# Patient Record
Sex: Female | Born: 1980 | Race: White | Hispanic: No | Marital: Married | Smoking: Current every day smoker
Health system: Southern US, Community
[De-identification: ages and names within clinical notes are randomized; demographics above are authoritative.]

## PROBLEM LIST (undated history)

## (undated) DIAGNOSIS — E079 Disorder of thyroid, unspecified: Secondary | ICD-10-CM

## (undated) DIAGNOSIS — L732 Hidradenitis suppurativa: Secondary | ICD-10-CM

---

## 2000-04-20 ENCOUNTER — Other Ambulatory Visit: Admission: RE | Admit: 2000-04-20 | Discharge: 2000-04-20 | Payer: Self-pay | Admitting: Obstetrics and Gynecology

## 2001-07-01 ENCOUNTER — Other Ambulatory Visit: Admission: RE | Admit: 2001-07-01 | Discharge: 2001-07-01 | Payer: Self-pay | Admitting: Obstetrics and Gynecology

## 2002-06-02 ENCOUNTER — Other Ambulatory Visit: Admission: RE | Admit: 2002-06-02 | Discharge: 2002-06-02 | Payer: Self-pay | Admitting: Obstetrics and Gynecology

## 2003-12-07 ENCOUNTER — Other Ambulatory Visit: Admission: RE | Admit: 2003-12-07 | Discharge: 2003-12-07 | Payer: Self-pay | Admitting: Obstetrics and Gynecology

## 2005-01-06 ENCOUNTER — Other Ambulatory Visit: Admission: RE | Admit: 2005-01-06 | Discharge: 2005-01-06 | Payer: Self-pay | Admitting: Obstetrics & Gynecology

## 2005-12-29 ENCOUNTER — Ambulatory Visit (HOSPITAL_COMMUNITY): Admission: RE | Admit: 2005-12-29 | Discharge: 2005-12-29 | Payer: Self-pay | Admitting: *Deleted

## 2005-12-30 ENCOUNTER — Ambulatory Visit (HOSPITAL_COMMUNITY): Admission: RE | Admit: 2005-12-30 | Discharge: 2005-12-30 | Payer: Self-pay | Admitting: *Deleted

## 2006-01-08 ENCOUNTER — Encounter: Admission: RE | Admit: 2006-01-08 | Discharge: 2006-01-08 | Payer: Self-pay | Admitting: Nephrology

## 2006-02-02 ENCOUNTER — Inpatient Hospital Stay (HOSPITAL_COMMUNITY): Admission: AD | Admit: 2006-02-02 | Discharge: 2006-02-03 | Payer: Self-pay | Admitting: Obstetrics and Gynecology

## 2006-02-02 HISTORY — PX: LAPAROSCOPIC GASTRIC BANDING: SHX1100

## 2006-03-09 ENCOUNTER — Ambulatory Visit (HOSPITAL_COMMUNITY): Admission: RE | Admit: 2006-03-09 | Discharge: 2006-03-10 | Payer: Self-pay | Admitting: *Deleted

## 2006-03-16 ENCOUNTER — Ambulatory Visit (HOSPITAL_COMMUNITY): Admission: RE | Admit: 2006-03-16 | Discharge: 2006-03-17 | Payer: Self-pay | Admitting: *Deleted

## 2006-03-23 ENCOUNTER — Encounter: Admission: RE | Admit: 2006-03-23 | Discharge: 2006-06-21 | Payer: Self-pay | Admitting: Nephrology

## 2006-05-02 ENCOUNTER — Emergency Department (HOSPITAL_COMMUNITY): Admission: EM | Admit: 2006-05-02 | Discharge: 2006-05-03 | Payer: Self-pay | Admitting: *Deleted

## 2006-06-29 ENCOUNTER — Inpatient Hospital Stay (HOSPITAL_COMMUNITY): Admission: AD | Admit: 2006-06-29 | Discharge: 2006-06-29 | Payer: Self-pay | Admitting: Obstetrics and Gynecology

## 2007-01-05 ENCOUNTER — Ambulatory Visit: Payer: Self-pay | Admitting: Cardiology

## 2007-01-14 ENCOUNTER — Encounter: Payer: Self-pay | Admitting: Cardiology

## 2007-01-14 ENCOUNTER — Ambulatory Visit: Payer: Self-pay | Admitting: Cardiology

## 2007-01-14 ENCOUNTER — Ambulatory Visit: Payer: Self-pay

## 2007-01-14 LAB — CONVERTED CEMR LAB
CO2: 26 meq/L (ref 19–32)
Calcium: 8.9 mg/dL (ref 8.4–10.5)
Chloride: 106 meq/L (ref 96–112)
Creatinine, Ser: 0.5 mg/dL (ref 0.4–1.2)
Glucose, Bld: 92 mg/dL (ref 70–99)
Magnesium: 1.7 mg/dL (ref 1.5–2.5)
TSH: 3.4 microintl units/mL (ref 0.35–5.50)

## 2007-02-02 ENCOUNTER — Ambulatory Visit (HOSPITAL_COMMUNITY): Admission: RE | Admit: 2007-02-02 | Discharge: 2007-02-02 | Payer: Self-pay | Admitting: Obstetrics & Gynecology

## 2007-05-09 ENCOUNTER — Inpatient Hospital Stay (HOSPITAL_COMMUNITY): Admission: AD | Admit: 2007-05-09 | Discharge: 2007-05-11 | Payer: Self-pay | Admitting: Obstetrics & Gynecology

## 2008-09-17 ENCOUNTER — Ambulatory Visit (HOSPITAL_COMMUNITY): Admission: RE | Admit: 2008-09-17 | Discharge: 2008-09-17 | Payer: Self-pay | Admitting: Obstetrics & Gynecology

## 2008-09-28 ENCOUNTER — Ambulatory Visit (HOSPITAL_COMMUNITY): Admission: RE | Admit: 2008-09-28 | Discharge: 2008-09-28 | Payer: Self-pay | Admitting: Obstetrics and Gynecology

## 2008-11-30 ENCOUNTER — Ambulatory Visit (HOSPITAL_COMMUNITY): Admission: RE | Admit: 2008-11-30 | Discharge: 2008-11-30 | Payer: Self-pay | Admitting: Obstetrics and Gynecology

## 2008-12-11 ENCOUNTER — Ambulatory Visit (HOSPITAL_COMMUNITY): Admission: RE | Admit: 2008-12-11 | Discharge: 2008-12-11 | Payer: Self-pay | Admitting: Obstetrics and Gynecology

## 2009-01-12 ENCOUNTER — Inpatient Hospital Stay (HOSPITAL_COMMUNITY): Admission: AD | Admit: 2009-01-12 | Discharge: 2009-01-12 | Payer: Self-pay | Admitting: Obstetrics & Gynecology

## 2009-01-13 ENCOUNTER — Inpatient Hospital Stay (HOSPITAL_COMMUNITY): Admission: AD | Admit: 2009-01-13 | Discharge: 2009-01-15 | Payer: Self-pay | Admitting: Obstetrics & Gynecology

## 2010-02-23 ENCOUNTER — Encounter: Payer: Self-pay | Admitting: Obstetrics and Gynecology

## 2010-05-06 LAB — CBC
HCT: 27.2 % — ABNORMAL LOW (ref 36.0–46.0)
MCHC: 33.5 g/dL (ref 30.0–36.0)
MCV: 86.7 fL (ref 78.0–100.0)
MCV: 87.9 fL (ref 78.0–100.0)
Platelets: 183 10*3/uL (ref 150–400)
RBC: 4.18 MIL/uL (ref 3.87–5.11)
WBC: 12.6 10*3/uL — ABNORMAL HIGH (ref 4.0–10.5)

## 2010-05-06 LAB — RH IMMUNE GLOB WKUP(>/=20WKS)(NOT WOMEN'S HOSP)

## 2010-05-06 LAB — RPR: RPR Ser Ql: NONREACTIVE

## 2010-06-17 NOTE — Assessment & Plan Note (Signed)
[facility]                            CARDIOLOGY OFFICE NOTE   NAME:Maguire, ARADIA ESTEY                   MRN:          [id number]  DATE:01/05/2007                            DOB:          April 14, 1980    I was asked by Dr. Dareen Piano and Leilani Able to evaluate Nilda Calamity, a delightful 30 year old married white female, who is about  [redacted] weeks pregnant, for palpitations.   She has never had any palpitations or heart problems before.  She  noticed these as a flutter that lasted a few seconds.  If she checks  her carotid (she was once a CNA), she will feel that her heart is  beating regular.  She has had no presyncope, syncope, or shortness of  breath.  She has had no chest pain.   She has noticed some increased swelling in her lower extremities.  She  denies any true orthopnea or PND.   PAST MEDICAL HISTORY:  She has battled with her weight for years.  She  had a Lap Band by Dr. Baruch Merl on March 16, 2006.  Preoperative  x-ray showed mild chronic bronchitic changes, but heart size was normal.   She does not smoke.  She quit August 20, 2006.  She does not use  recreational products and never has.  She does not drink alcohol.  She  drinks very little caffeine.  She does not use any dietary supplements  at present or suppressants.  She is on prenatal vitamins only.   Her only surgery was a Lap Band in February 2008.   FAMILY HISTORY:  Negative for sudden cardiac death or premature coronary  disease.   SOCIAL HISTORY:  She works at [employer], mostly working with  computers.  She is married and has no children.   REVIEW OF SYSTEMS:  Negative, other than HPI.   PHYSICAL EXAMINATION:  VITAL SIGNS:  Blood pressure is elevated at  142/87.  She says on Monday, two days ago, it was 118/76 at their  office.  Her weight is 284.  Her height is 5 feet 3 inches.  Her heart  rate is 97 and regular.  Her EKG is normal, with a normal PR,  QRS, and  QTc interval.  Respiratory rate is 18.  GENERAL:  She is in no acute distress.  Alert and oriented.  HEENT:  Normocephalic, atraumatic.  PERRLA.  Extraocular movements  intact.  Sclerae clear.  Facial symmetry is normal.  SKIN:  Warm and dry.  NECK:  Supple.  Carotid upstrokes were equal bilaterally, without  bruits.  No JVD.  Thyroid is not enlarged.  Trachea is midline.  LUNGS:  Clear.  HEART:  Reveals a poorly appreciated PMI.  Normal S1, S2, without  gallop.  ABDOMEN:  Gravid.  Bowel sounds are present.  EXTREMITIES:  Revealed trace edema to 1+ edema.  Pulses are intact.  No  sign of DVT.  She does have a history of varicose veins.  NEUROLOGIC:  Intact.   ASSESSMENT:  1. Palpitations, most likely benign.  2. Peripheral edema.  3. Elevated blood pressure today.  She assures me it has been good and      will follow it closely.   PLAN:  1. A 2D echocardiogram to rule out any structural heart disease or      pregnancy-associated cardiomyopathy.  2. Check magnesium, potassium, and TSH.  3. Monitor blood pressure closely.     Thomas C. Daleen Squibb, MD, [facility]  Electronically Signed    TCW/MedQ  DD: 01/05/2007  DT: 01/06/2007  Job #: [id number]   cc:   Malva Limes, M.D.  Leilani Able, P.A.-C.

## 2010-06-20 NOTE — Op Note (Signed)
Tracey Reynolds, Tracey Reynolds            ACCOUNT NO.:  [id number]   MEDICAL RECORD NO.:  [id number]          PATIENT TYPE:  OIB   LOCATION:  6213                         FACILITY:  [facility]   PHYSICIAN:  Alfonse Ras, MD   DATE OF BIRTH:  May 08, 1980   DATE OF PROCEDURE:  03/16/2006  DATE OF DISCHARGE:                               OPERATIVE REPORT   PREOPERATIVE DIAGNOSIS:  Morbid obesity, medically refractory.   POSTOPERATIVE DIAGNOSIS:  Morbid obesity, medically refractory.   PROCEDURE:  Laparoscopic adjustable gastric banding with an APS standard  band.   ANESTHESIA:  General.   SURGEON:  Alfonse Ras, MD   ASSISTANT:  Sandria Bales. Ezzard Standing, M.D.   DESCRIPTION:  The patient was taken to the operating room and placed in  the supine position.  After adequate general anesthesia was induced  using the endotracheal tube, the abdomen was prepped and draped in a  normal sterile fashion.  Using a transverse supraumbilical incision, I  dissected down to the fascia.  This was opened vertically and an #0  Vicryl pursestring suture was placed around the fascial defect.  The  Hasson trocar was placed in the abdomen, and pneumoperitoneum was  obtained.   Under direct vision additional 15-and-11-mm trocars were placed.  A 5-mm  trocar was placed in the subxiphoid region to retract the liver  anteriorly.  A 5-mm port was placed in the left abdomen.  The angle of  His was identified, and sharply dissected.  There was some moderate  amount of bleeding from the diaphragm which was controlled easily with  clips.  The retrogastric space was then entered bluntly at the angle of  His.   I then turned my attention to the lesser curvature, and using a pars  flaccida technique, the lap band passer was placed in a retrogastric  position, and brought out at the angle of His.  A standard APS band was  placed through the band passer and brought out in the retrogastric  position.  Anesthesia had difficulty  passing the sizing tube, and the  patient had minimal fat around the upper part of the stomach; and,  therefore, we opted to go ahead and close the band without the sizing  tube in place.  This cinched down easily; and then the plication was  performed with interrupted 2-0 Ethibond sutures; approximating the  serosa-to-serosa in the standard fashion.  The band moved easily.   The [facility] liver retractor was removed under direct vision.  Pneumoperitoneum was released.  The band tubing was brought out through  the 11-mm trocar site.  It was attached to the port; and the port was  tacked down to the  anterior abdominal wall fascia, using interrupted 2-0 Prolenes.  Skin  incisions were closed with subcuticular 4-0 Monocryl.  Steri-Strips and  sterile dressings were applied.  The patient tolerated the procedure  well, and went to PACU in good condition.      Alfonse Ras, MD  Electronically Signed     KRE/MEDQ  D:  03/16/2006  T:  03/16/2006  Job:  [id number]

## 2010-06-20 NOTE — Op Note (Signed)
NAMEJASMARIE, Tracey Reynolds            ACCOUNT NO.:  [id number]   MEDICAL RECORD NO.:  [id number]          PATIENT TYPE:  AMB   LOCATION:  DAY                          [facility]AC[facility]   PHYSICIAN:  Alfonse Ras, MD   DATE OF BIRTH:  1980/08/28   DATE OF PROCEDURE:  03/09/2006  DATE OF DISCHARGE:                               OPERATIVE REPORT   PREOPERATIVE DIAGNOSIS:  Morbid obesity.   POSTOPERATIVE DIAGNOSIS:  Morbid obesity.   PROCEDURE:  Placement of laparoscopic trocar with probable intrathoracic  placement and then cancellation of laparoscopic adjustable gastric  banding.   HISTORY OF PRESENT ILLNESS:  Patient is a 30 year old white female with  medically refractory morbid obesity, who has been counseled extensively  about laparoscopic adjustable gastric banding.  She has gone through an  extensive preoperative workup and notified of the risks and benefits of  laparoscopic adjustable gastric banding.  Patient consented to proceed.   DESCRIPTION:  Patient was taken to the operating room and placed in a  supine position.  After adequate anesthesia was induced using an  endotracheal tube, the abdomen was prepped and draped in the normal  sterile fashion.  Using a small incision in the left upper quadrant at  an oblique angle, I placed the Optiview under direct view, saw  subcutaneous fat, saw muscle fibers, then entered the cavity.  What I  realized after insufflating a small amount of CO2 is that I was actually  visualizing the lung.  For that reason, the trocar was immediately  removed after aspirating the air.  I opted not to continue forward with  laparoscopic adjustable gastric banding, as I did not want to insufflate  the abdomen knowing that there was intrathoracic access.  The patient  remained hemodynamically stable.  She had one episode when we went into  head up of postural hypertension, which recovered when she went back to  flat position.  Her O2 sats remained in  the 99-100% range.  She had  normal breath sounds, and airway pressures remained normal.  The small  incision in the left upper quadrant was closed with staples.  She  tolerated the procedure hemodynamically well and was taken to the  recovery room for a STAT probable chest x-ray and continuous pulse ox  monitoring.      Alfonse Ras, MD  Electronically Signed     KRE/MEDQ  D:  03/09/2006  T:  03/09/2006  Job:  [id number]

## 2010-10-28 LAB — CBC
HCT: 27.1 — ABNORMAL LOW
Hemoglobin: 13.1
Hemoglobin: 9.5 — ABNORMAL LOW
MCV: 85.5
Platelets: 224
RBC: 3.19 — ABNORMAL LOW
RBC: 4.45
RDW: 14.9
WBC: 18 — ABNORMAL HIGH
WBC: 21.6 — ABNORMAL HIGH

## 2010-10-28 LAB — RPR: RPR Ser Ql: NONREACTIVE

## 2010-10-29 IMAGING — US US OB COMP +14 WK
2 series · 14 of 28 positions shown · non-contrast
Comparison: none

OBSTETRICAL ULTRASOUND:
 This ultrasound exam was performed in the [HOSPITAL] Ultrasound Department.  The OB US report was generated in the AS system, and faxed to the ordering physician.  This report is also available in [REDACTED] PACS.

[Series 1: us ob comp +14 wk · 12 of 47 slices shown (1 of 2)]
[im 2/47]
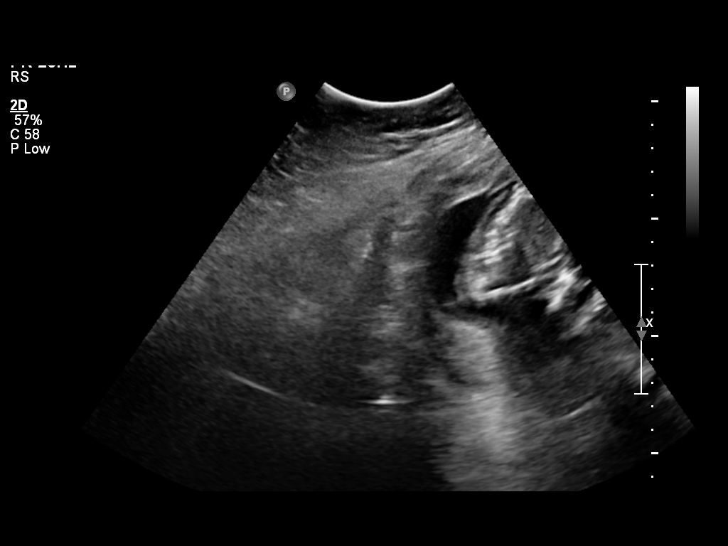
[im 6/47]
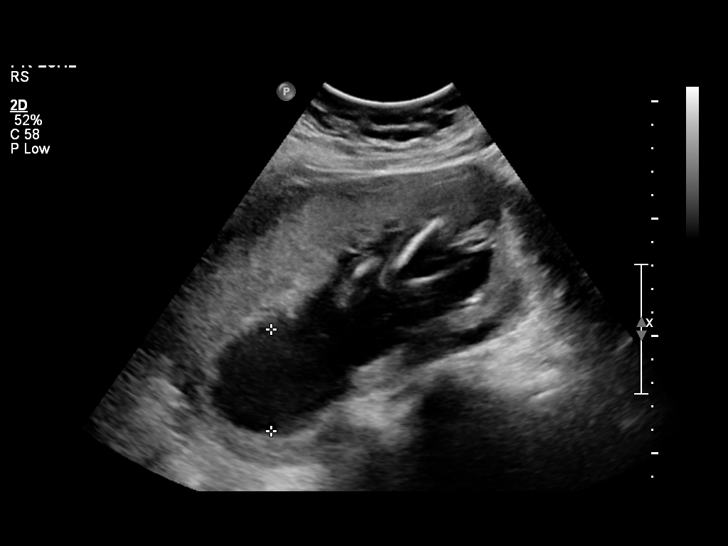
[im 10/47]
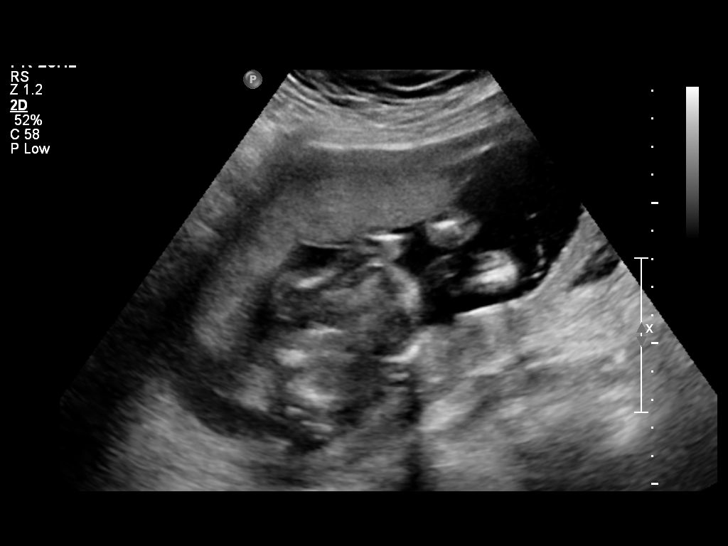
[im 14/47]
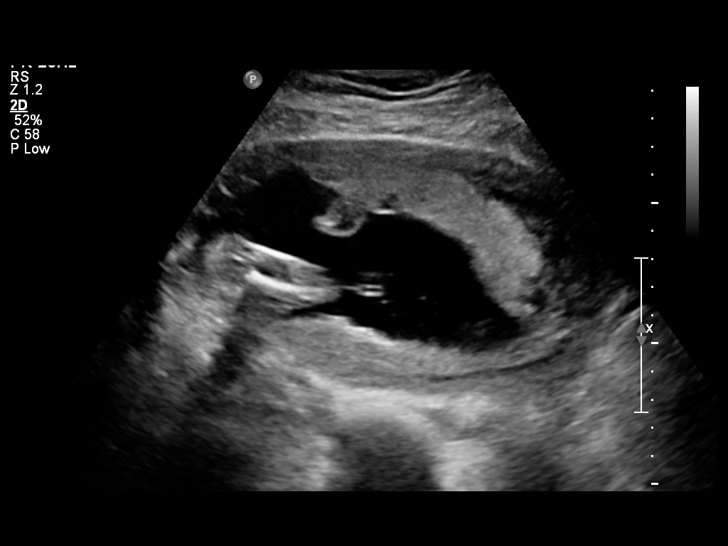
[im 18/47]
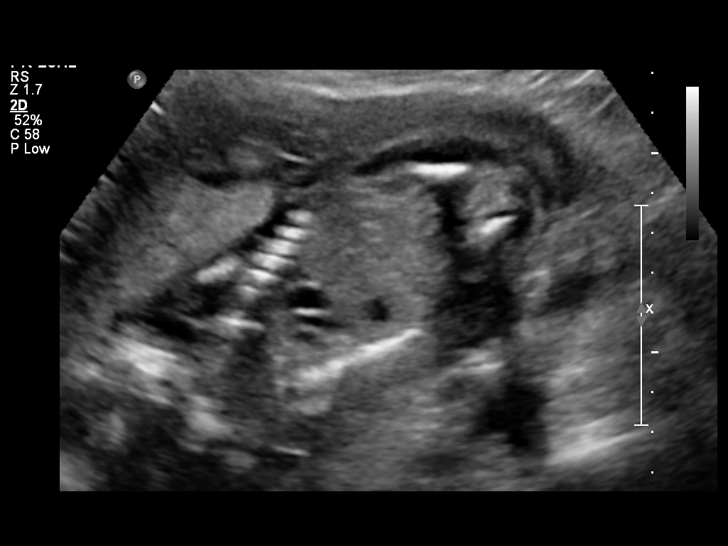
[im 22/47]
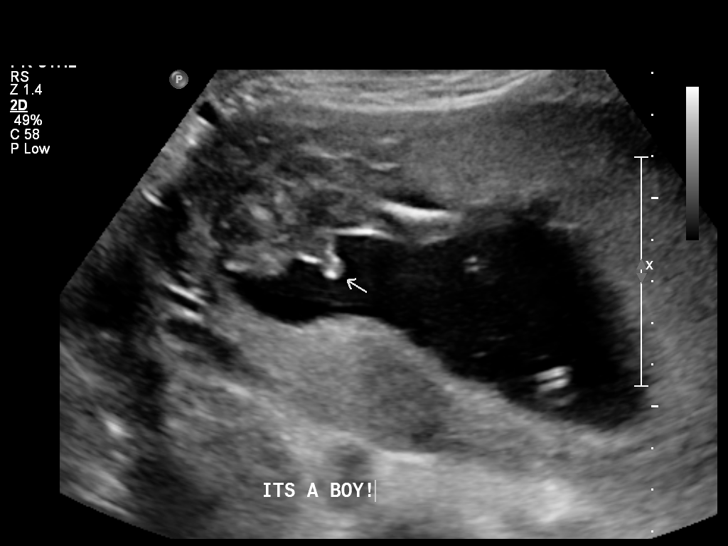
[im 25/47]
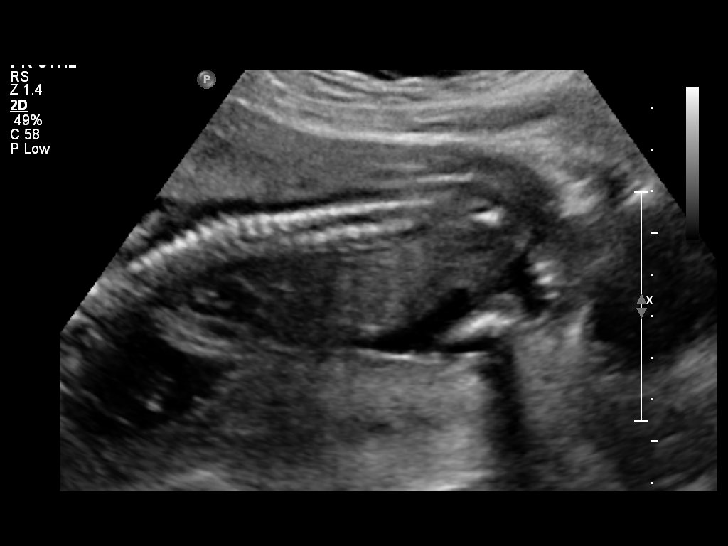
[im 29/47]
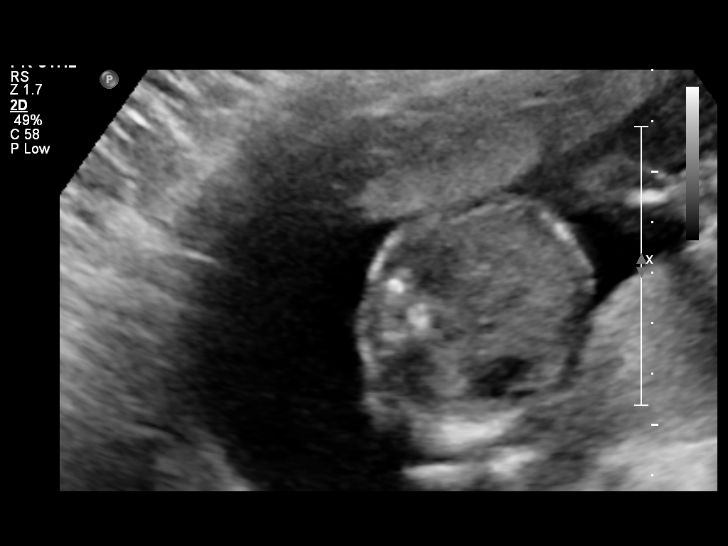
[im 33/47]
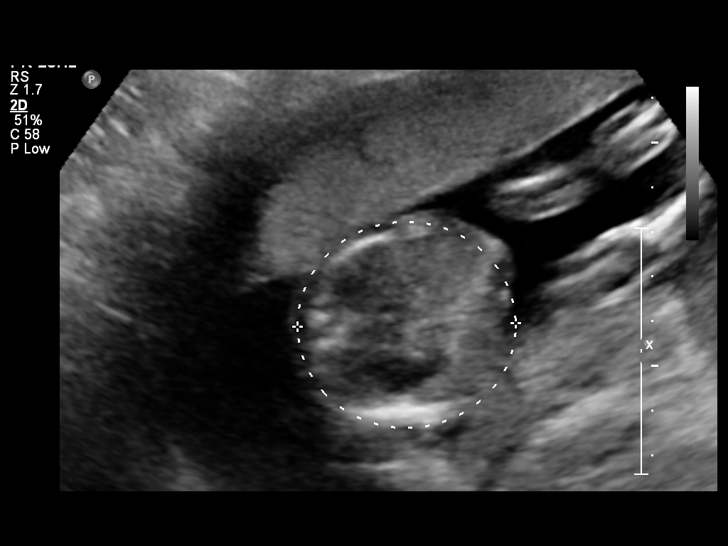
[im 37/47]
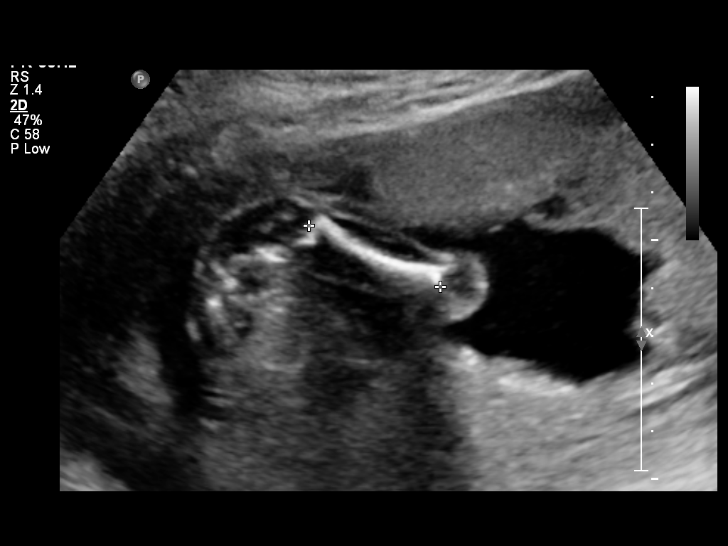
[im 41/47]
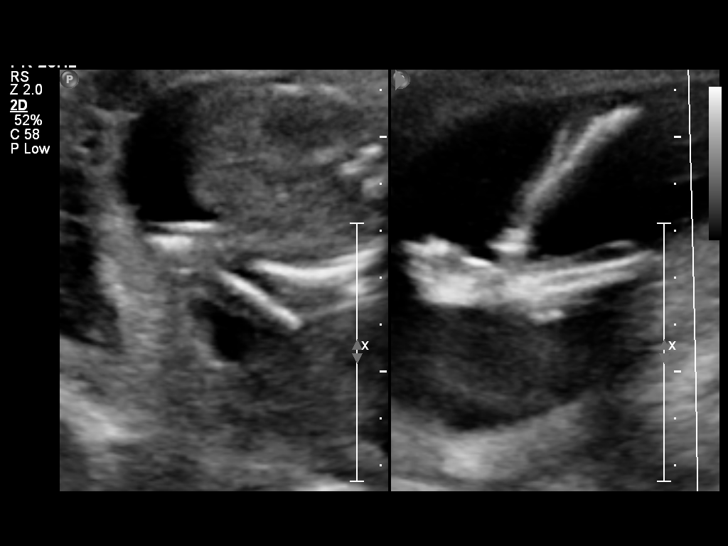
[im 45/47]
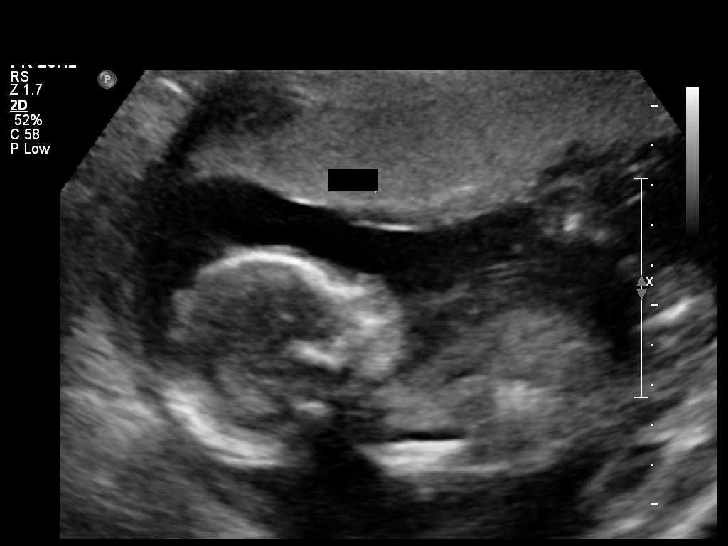

[Series 1: us ob comp +14 wk · 2 of 5 slices shown (2 of 2)]
[im 1/5]
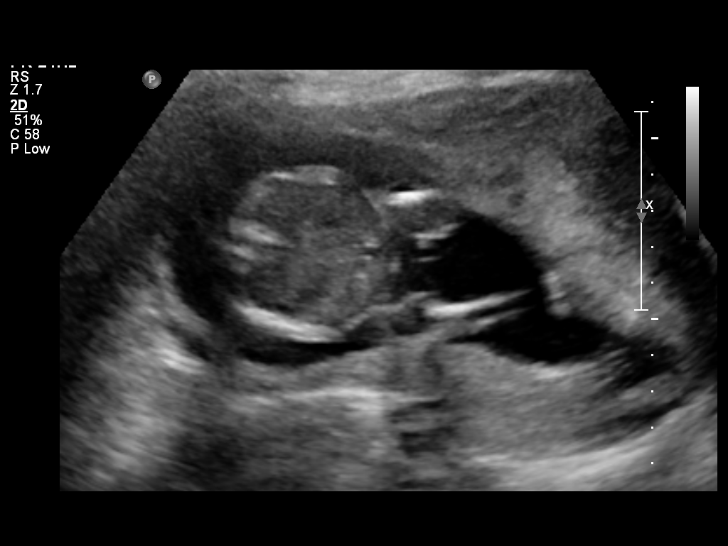
[im 5/5]
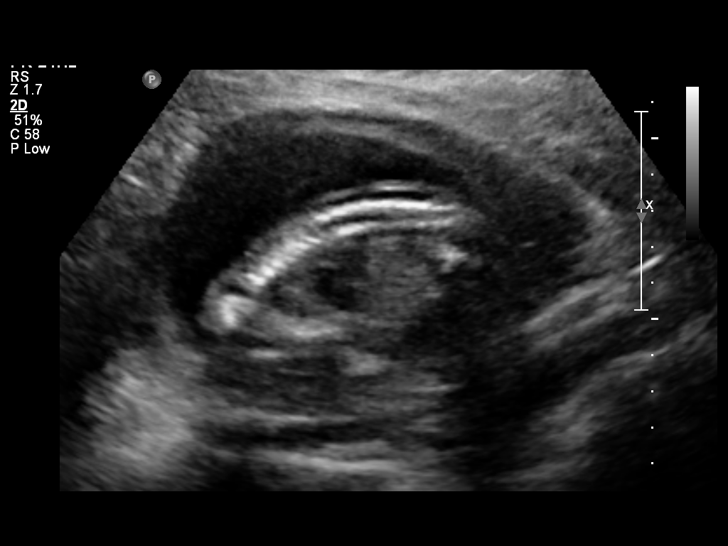

[14 of 28 positions shown; findings below may reference images not displayed]

IMPRESSION: See AS Obstetric US report.

## 2010-11-07 LAB — RH IMMUNE GLOBULIN WORKUP (NOT WOMEN'S HOSP)

## 2011-01-11 IMAGING — US US OB FOLLOW-UP
1 series · 14 of 28 positions shown · non-contrast
Comparison: none

OBSTETRICAL ULTRASOUND:
 This ultrasound exam was performed in the [HOSPITAL] Ultrasound Department.  The OB US report was generated in the AS system, and faxed to the ordering physician.  This report is also available in [HOSPITAL]?s [employer] and in [REDACTED] PACS.

[Series 1: us ob follow up · 29 acquisitions, 14 frames shown]
[im 2/29]
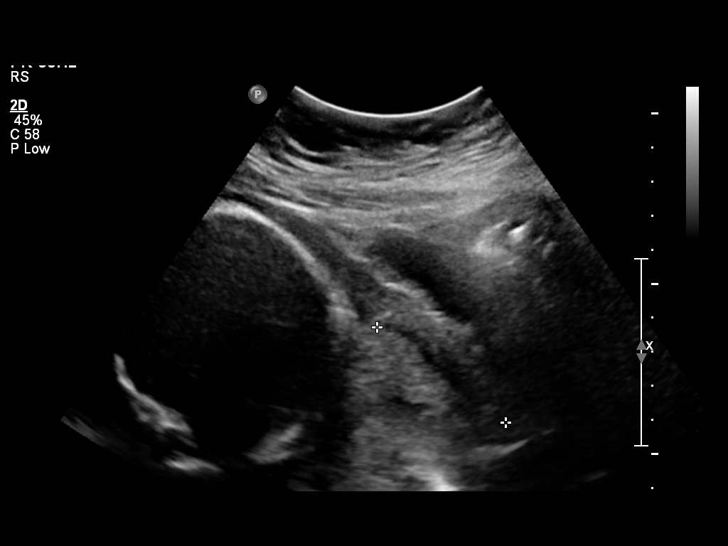
[im 4/29]
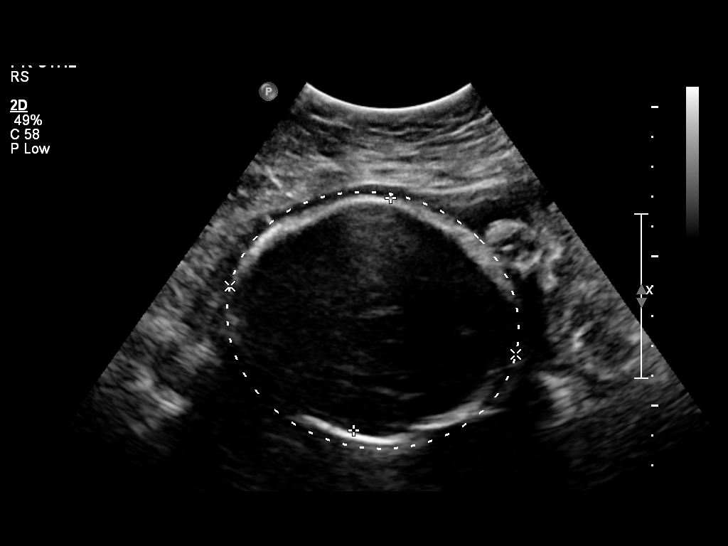
[im 6/29]
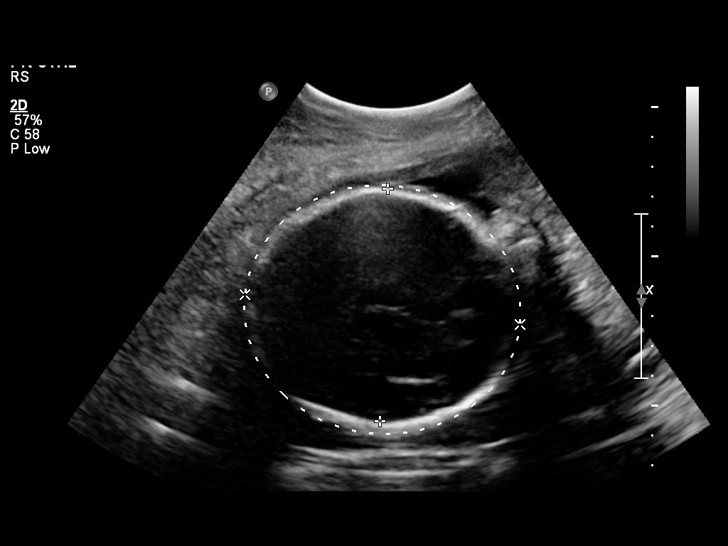
[im 8/29]
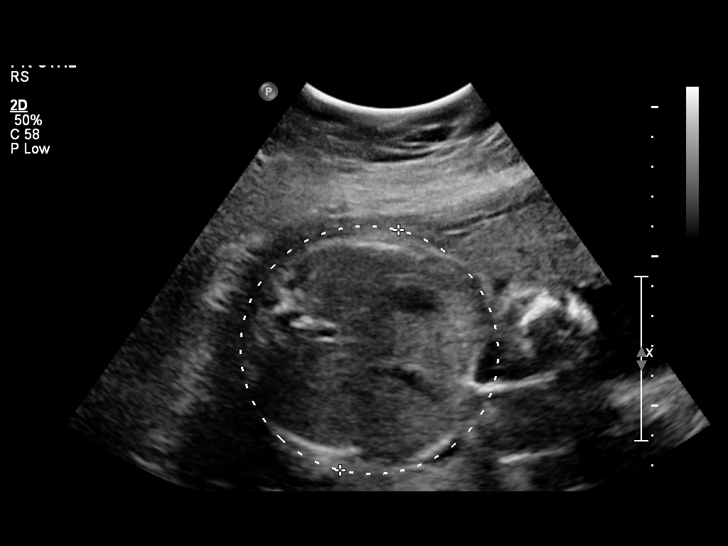
[im 10/29]
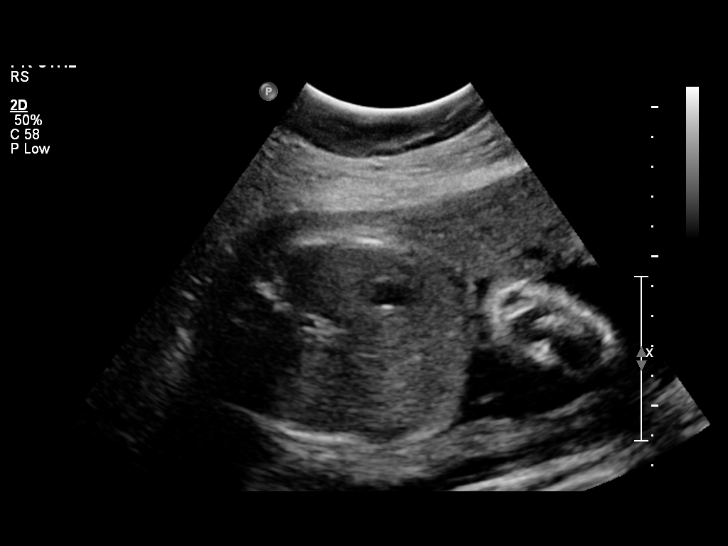
[im 12/29]
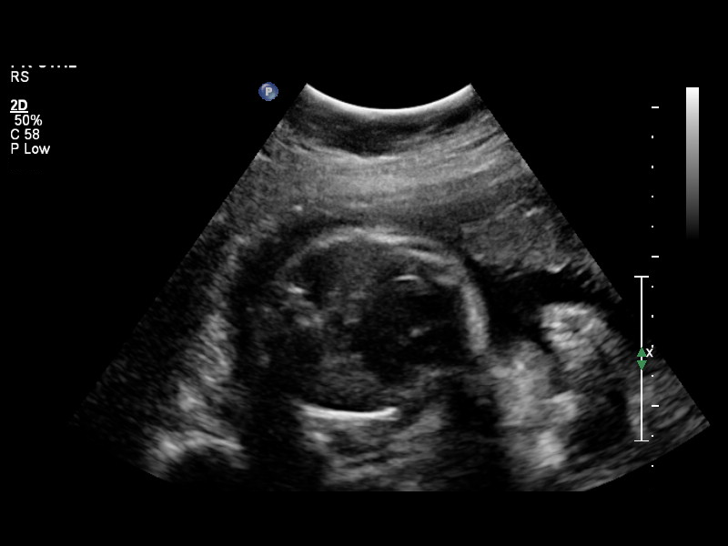
[im 14/29]
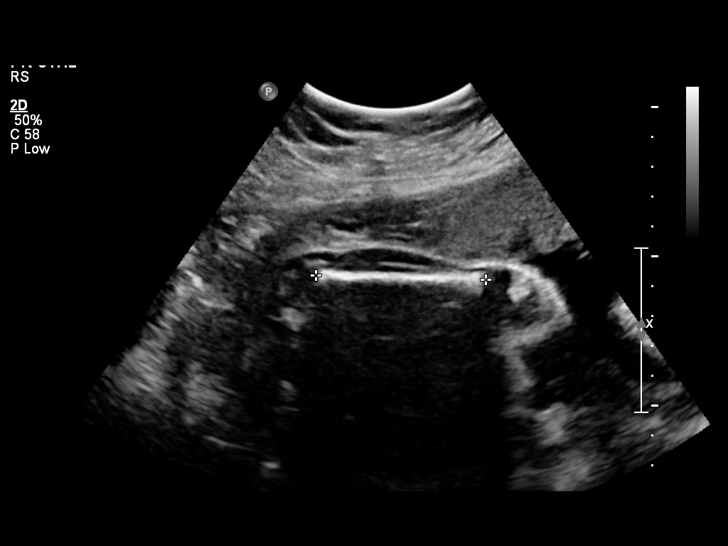
[im 16/29]
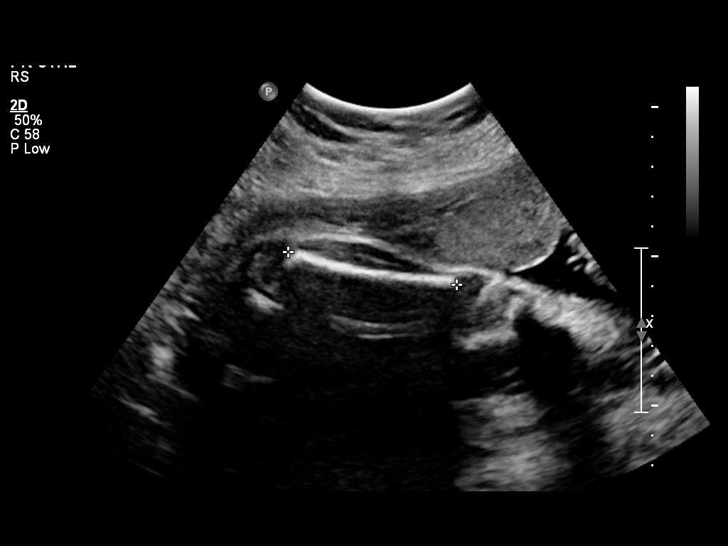
[im 18/29]
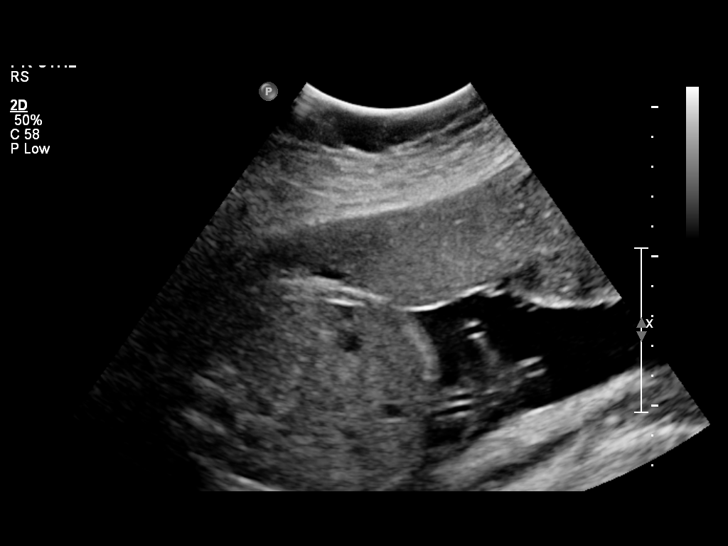
[im 20/29]
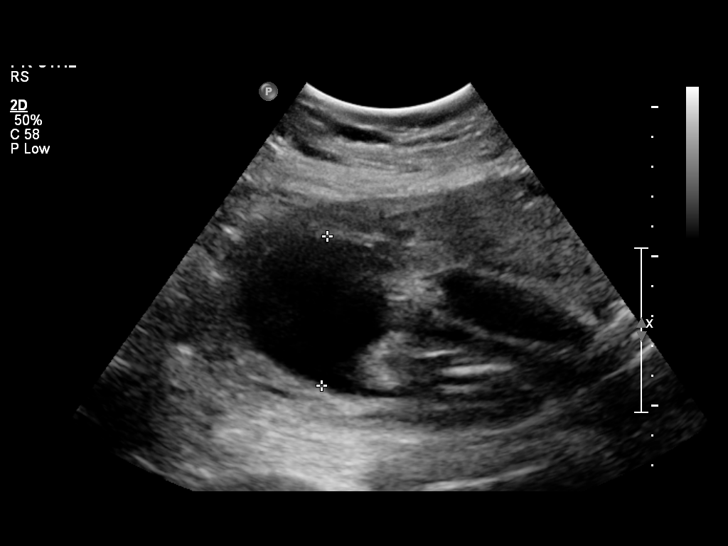
[im 22/29]
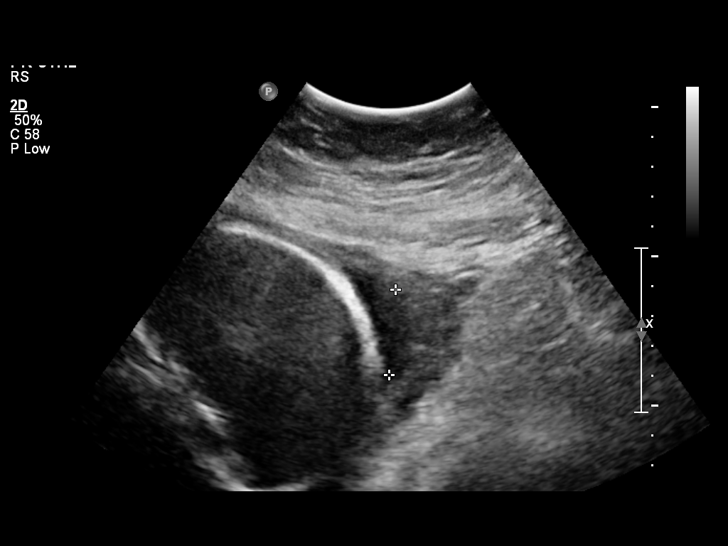
[im 24/29]
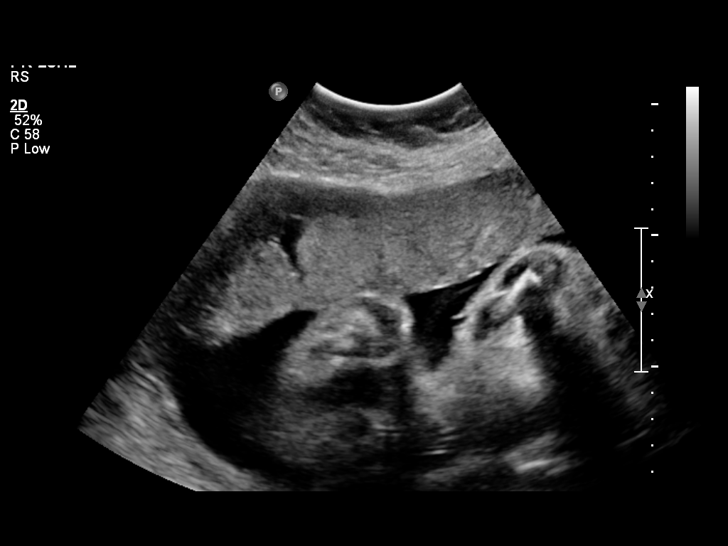
[im 26/29]
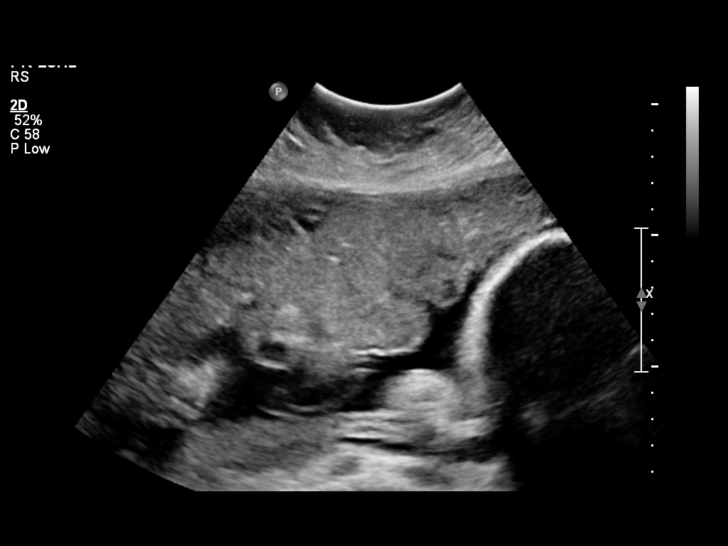
[im 29/29]
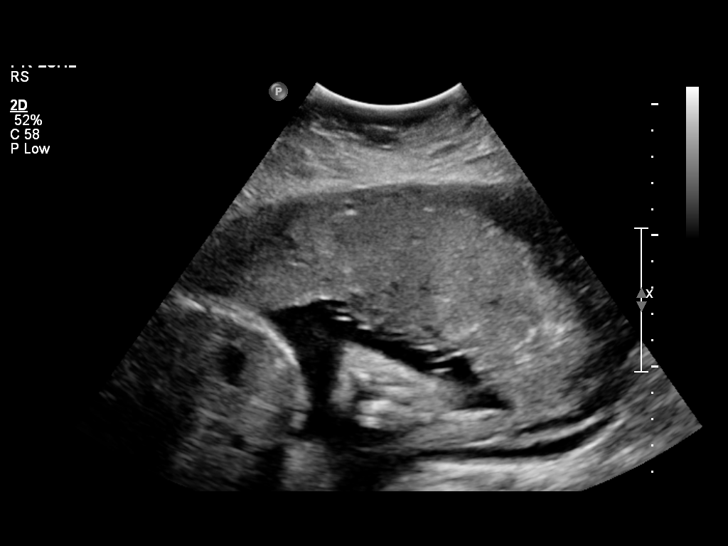

[14 of 28 positions shown; findings below may reference images not displayed]

IMPRESSION: See AS Obstetric US report.

## 2016-01-12 ENCOUNTER — Ambulatory Visit (HOSPITAL_COMMUNITY)
Admission: EM | Admit: 2016-01-12 | Discharge: 2016-01-12 | Disposition: A | Discharge status: Home / Self Care | Payer: 59

## 2016-01-12 ENCOUNTER — Encounter (HOSPITAL_COMMUNITY): Payer: Self-pay | Admitting: Emergency Medicine

## 2016-01-12 DIAGNOSIS — W540XXA Bitten by dog, initial encounter: Secondary | ICD-10-CM

## 2016-01-12 DIAGNOSIS — S61412A Laceration without foreign body of left hand, initial encounter: Secondary | ICD-10-CM | POA: Diagnosis not present

## 2016-01-12 HISTORY — DX: Hidradenitis suppurativa: L73.2

## 2016-01-12 HISTORY — DX: Disorder of thyroid, unspecified: E07.9

## 2016-01-12 MED ORDER — POVIDONE-IODINE 10 % EX SOLN
CUTANEOUS | Status: AC
  Filled 2016-01-12: qty 118

## 2016-01-12 MED ORDER — AMOXICILLIN-POT CLAVULANATE 875-125 MG PO TABS: 1.0000 | ORAL_TABLET | Freq: Two times a day (BID) | ORAL | 0 refills | Status: AC

## 2016-01-12 NOTE — ED Notes (Signed)
Animal control from completed and faxed to Animal Control. Form to be scanned into EHR.

## 2016-01-12 NOTE — ED Notes (Signed)
PT discharged by W. Oxford NP

## 2016-01-12 NOTE — ED Triage Notes (Signed)
[facility] with a [facility] of a dog bite to her left hand that occurred today. The patient reported that the dog was hers and its vaccines are up to date.

## 2016-01-12 NOTE — ED Provider Notes (Signed)
CSN: [id number]     Arrival date & time 01/12/16  1947 History   First MD Initiated Contact with Patient 01/12/16 2000     Chief Complaint  Patient presents with  . Animal Bite   (Consider location/radiation/quality/duration/timing/severity/associated sxs/prior Treatment) Patient presents with dog bite to left hand and left base of thumb.   The history is provided by the patient.  Animal Bite  Contact animal:  Dog Location:  Hand Hand injury location:  L hand Time since incident:  1 day Pain details:    Quality:  Sore   Severity:  Moderate   Progression:  Worsening Provoked: provoked   Notifications:  Animal control Animal's rabies vaccination status:  Up to date Animal in possession: yes   Tetanus status:  Up to date Relieved by:  Nothing Worsened by:  Nothing Ineffective treatments:  None tried   Past Medical History:  Diagnosis Date  . Hydradenitis   . Thyroid disease    Past Surgical History:  Procedure Laterality Date  . LAPAROSCOPIC GASTRIC BANDING  2008   History reviewed. No pertinent family history. Social History  Substance Use Topics  . Smoking status: Current Every Day Smoker    Packs/day: 0.50    Years: 15.00    Types: Cigarettes  . Smokeless tobacco: Never Used  . Alcohol use Yes   OB History    No data available     Review of Systems  Constitutional: Negative.   HENT: Negative.   Eyes: Negative.   Respiratory: Negative.   Cardiovascular: Negative.   Gastrointestinal: Negative.   Endocrine: Negative.   Genitourinary: Negative.   Musculoskeletal: Negative.   Skin: Positive for wound.  Allergic/Immunologic: Negative.   Neurological: Negative.   Hematological: Negative.   Psychiatric/Behavioral: Negative.     Allergies  Morphine and related  Home Medications   Prior to Admission medications   Medication Sig Start Date End Date Taking? Authorizing Provider  doxycycline (DORYX) 100 MG EC tablet Take 100 mg by mouth 2 (two) times  daily.   Yes Historical Provider, MD  levothyroxine (SYNTHROID, LEVOTHROID) 100 MCG tablet Take 100 mcg by mouth daily before breakfast.   Yes Historical Provider, MD  amoxicillin-clavulanate (AUGMENTIN) 875-125 MG tablet Take 1 tablet by mouth 2 (two) times daily. 01/12/16   Deatra CanterWilliam J Oxford, FNP   Meds Ordered and Administered this Visit  Medications - No data to display  BP 131/85 (BP Location: Right Arm)   Pulse 102   Temp 98.3 F (36.8 C)   Resp (!) 165   SpO2 96%  No data found.   Physical Exam  Constitutional: She appears well-developed and well-nourished.  HENT:  Head: Normocephalic and atraumatic.  Eyes: EOM are normal. Pupils are equal, round, and reactive to light.  Neck: Normal range of motion. Neck supple.  Cardiovascular: Normal rate, regular rhythm and normal heart sounds.   Pulmonary/Chest: Effort normal and breath sounds normal.  Abdominal: Soft. Bowel sounds are normal.  Skin: Skin is warm and dry.  Laceration left thumb   Nursing note and vitals reviewed.   Urgent Care Course   Clinical Course     .Marland Kitchen.Laceration Repair Date/Time: 01/12/2016 8:41 PM Performed by: Deatra CanterXFORD, WILLIAM J Authorized by: Deatra CanterXFORD, WILLIAM J   Consent:    Consent obtained:  Verbal   Consent given by:  Patient   Risks discussed:  Pain   Alternatives discussed:  No treatment Anesthesia (see MAR for exact dosages):    Anesthesia method:  Local infiltration  Local anesthetic:  Lidocaine 1% w/o epi Laceration details:    Location:  Hand   Hand location:  L palm   Length (cm):  1   Depth (mm):  1 Repair type:    Repair type:  Simple Exploration:    Hemostasis achieved with:  Direct pressure   Wound exploration: wound explored through full range of motion and entire depth of wound probed and visualized     Contaminated: yes   Treatment:    Area cleansed with:  Betadine and saline   Amount of cleaning:  Standard   Irrigation solution:  Sterile saline   Irrigation volume:   200   Irrigation method:  Syringe   Visualized foreign bodies/material removed: no   Skin repair:    Repair method:  Sutures   Suture size:  4-0   Suture material:  Prolene   Number of sutures:  1 Approximation:    Approximation:  Loose   Vermilion border: well-aligned   Post-procedure details:    Dressing:  Bulky dressing   Patient tolerance of procedure:  Tolerated well, no immediate complications Comments:     Patient advised to follow up in 2 days   (including critical care time)  Labs Review Labs Reviewed - No data to display  Imaging Review No results found.   Visual Acuity Review  Right Eye Distance:   Left Eye Distance:   Bilateral Distance:    Right Eye Near:   Left Eye Near:    Bilateral Near:         MDM   1. Dog bite, initial encounter   2. Laceration of left hand without foreign body, initial encounter    Augmetin 875mg  one po bid x 7 days #14    Deatra CanterWilliam J Oxford, FNP 01/12/16 2043

## 2016-03-30 DIAGNOSIS — Z01419 Encounter for gynecological examination (general) (routine) without abnormal findings: Secondary | ICD-10-CM | POA: Diagnosis not present

## 2016-03-30 DIAGNOSIS — Z6841 Body Mass Index (BMI) 40.0 and over, adult: Secondary | ICD-10-CM | POA: Diagnosis not present

## 2016-07-03 DIAGNOSIS — E039 Hypothyroidism, unspecified: Secondary | ICD-10-CM | POA: Diagnosis not present

## 2017-02-01 ENCOUNTER — Emergency Department (HOSPITAL_COMMUNITY): Admission: EM | Admit: 2017-02-01 | Discharge: 2017-02-01 | Discharge status: Absent without leave | Payer: 59

## 2017-06-24 ENCOUNTER — Other Ambulatory Visit: Payer: Self-pay | Admitting: Student

## 2017-06-24 DIAGNOSIS — Z9884 Bariatric surgery status: Secondary | ICD-10-CM

## 2023-04-02 ENCOUNTER — Other Ambulatory Visit: Payer: Self-pay | Admitting: *Deleted

## 2023-04-02 DIAGNOSIS — E063 Autoimmune thyroiditis: Secondary | ICD-10-CM

## 2023-04-14 ENCOUNTER — Other Ambulatory Visit: Payer: Self-pay | Admitting: *Deleted

## 2023-04-14 DIAGNOSIS — R1084 Generalized abdominal pain: Secondary | ICD-10-CM

## 2023-04-21 ENCOUNTER — Ambulatory Visit
Admission: RE | Admit: 2023-04-21 | Discharge: 2023-04-21 | Disposition: A | Discharge status: Home / Self Care | Payer: Self-pay | Source: Ambulatory Visit | Attending: *Deleted | Admitting: *Deleted

## 2023-04-21 ENCOUNTER — Other Ambulatory Visit: Payer: Self-pay

## 2023-04-21 DIAGNOSIS — R1084 Generalized abdominal pain: Secondary | ICD-10-CM

## 2023-04-21 DIAGNOSIS — E063 Autoimmune thyroiditis: Secondary | ICD-10-CM
